# Patient Record
Sex: Male | Born: 1980
Health system: Southern US, Community
[De-identification: ages and names within clinical notes are randomized; demographics above are authoritative.]

---

## 2012-04-22 ENCOUNTER — Encounter (HOSPITAL_COMMUNITY): Payer: Self-pay

## 2012-04-22 ENCOUNTER — Emergency Department (HOSPITAL_COMMUNITY)
Admission: EM | Admit: 2012-04-22 | Discharge: 2012-04-22 | Disposition: A | Discharge status: Home / Self Care | Payer: Managed Care, Other (non HMO) | Source: Home / Self Care | Attending: Family Medicine | Admitting: Family Medicine

## 2012-04-22 DIAGNOSIS — K529 Noninfective gastroenteritis and colitis, unspecified: Secondary | ICD-10-CM

## 2012-04-22 DIAGNOSIS — K5289 Other specified noninfective gastroenteritis and colitis: Secondary | ICD-10-CM

## 2012-04-22 MED ORDER — ONDANSETRON HCL 4 MG PO TABS: 4.0000 mg | ORAL_TABLET | Freq: Four times a day (QID) | ORAL | Status: AC

## 2012-04-22 NOTE — ED Notes (Signed)
C/o n/v, HA since 6-16; NAD at present; has used tylenol w minimal relief ; needs note for job

## 2012-04-22 NOTE — ED Provider Notes (Signed)
History     CSN: [id number]  Arrival date & time 04/22/12  1236   First MD Initiated Contact with Patient 04/22/12 1246      Chief Complaint  Patient presents with  . Headache    (Consider location/radiation/quality/duration/timing/severity/associated sxs/prior treatment) Patient is a 31 y.o. male presenting with headaches. The history is provided by the patient.  Headache The primary symptoms include headaches, nausea and vomiting. Primary symptoms do not include fever. The symptoms began 3 to 5 days ago. The symptoms are resolved. Context: onset after eating some food, , no diarrhea, , headache mild  from not eating well.    History reviewed. No pertinent past medical history.  History reviewed. No pertinent past surgical history.  History reviewed. No pertinent family history.  History  Substance Use Topics  . Smoking status: Never Smoker   . Smokeless tobacco: Not on file  . Alcohol Use: Yes      Review of Systems  Constitutional: Positive for appetite change. Negative for fever and chills.  Respiratory: Negative for cough.   Gastrointestinal: Positive for nausea and vomiting.  Neurological: Positive for headaches.    Allergies  Review of patient's allergies indicates no known allergies.  Home Medications   Current Outpatient Rx  Name Route Sig Dispense Refill  . ONDANSETRON HCL 4 MG PO TABS Oral Take 1 tablet (4 mg total) by mouth every 6 (six) hours. 6 tablet 0    BP 141/73  Pulse 62  Temp 98 F (36.7 C) (Oral)  Resp 16  SpO2 100%  Physical Exam  Nursing note and vitals reviewed. Constitutional: He is oriented to person, place, and time. He appears well-developed and well-nourished.  HENT:  Head: Normocephalic.  Right Ear: External ear normal.  Left Ear: External ear normal.  Mouth/Throat: Oropharynx is clear and moist.  Eyes: Conjunctivae are normal. Pupils are equal, round, and reactive to light.  Neck: Normal range of motion. Neck supple.    Pulmonary/Chest: Breath sounds normal.  Abdominal: Soft. Bowel sounds are normal. There is no tenderness. There is no rebound.  Lymphadenopathy:    He has no cervical adenopathy.  Neurological: He is alert and oriented to person, place, and time.  Skin: Skin is warm and dry. No rash noted.  Psychiatric: He has a normal mood and affect.    ED Course  Procedures (including critical care time)  Labs Reviewed - No data to display No results found.   1. Gastroenteritis, acute       MDM          Linna Hoff, MD 04/22/12 1339

## 2014-07-06 ENCOUNTER — Ambulatory Visit (INDEPENDENT_AMBULATORY_CARE_PROVIDER_SITE_OTHER): Payer: 59 | Admitting: Internal Medicine

## 2014-07-06 VITALS — BP 122/80 | HR 60 | Temp 98.4°F | Resp 17 | Ht 66.0 in | Wt 178.0 lb

## 2014-07-06 DIAGNOSIS — K047 Periapical abscess without sinus: Secondary | ICD-10-CM

## 2014-07-06 MED ORDER — HYDROCODONE-ACETAMINOPHEN 5-325 MG PO TABS: 1.0000 | ORAL_TABLET | Freq: Four times a day (QID) | ORAL | Status: DC | PRN

## 2014-07-06 MED ORDER — AMOXICILLIN 500 MG PO CAPS: 1000.0000 mg | ORAL_CAPSULE | Freq: Two times a day (BID) | ORAL | Status: DC

## 2014-07-06 NOTE — Progress Notes (Signed)
   Subjective:  This chart was scribed for Tonye Pearson, MD by Bronson Curb, ED Scribe. This patient was seen in room Room/bed info 12 and the patient's care was started at 12:33 PM.   Patient ID: Evan Johnson, male    DOB: 1981/01/16, 33 y.o.   MRN: [id number]  HPI  HPI Comments: ZOHAIR EPP is a 33 y.o. male who presents to the [facility] complaining of constant, moderate, left sided dental pain onset 1 week ago. There is associated subjective fever (triage temp 98.4 F) and left sided facial swelling. Patient reports the pain is exacerbated when biting down. Patient has no history of significant health conditions.  There are no active problems to display for this patient.   Review of Systems  Constitutional: Positive for fever (subjective).  HENT: Positive for dental problem (left side) and facial swelling (left side).        Objective:   Physical Exam  Nursing note and vitals reviewed. Constitutional: He is oriented to person, place, and time. He appears well-developed and well-nourished. No distress.  HENT:  Head: Normocephalic and atraumatic.  Face is on swollen on the left side. Malar tender to palpation. Left upper molar is destroyed by disease with redness and swelling at the gumline.  Eyes: Conjunctivae and EOM are normal.  Neck: Neck supple.  Cardiovascular: Normal rate.   Pulmonary/Chest: Effort normal. No respiratory distress.  Musculoskeletal: Normal range of motion.  Neurological: He is alert and oriented to person, place, and time.  Skin: Skin is warm and dry.  Psychiatric: He has a normal mood and affect. His behavior is normal.     BP 122/80  Pulse 60  Temp(Src) 98.4 F (36.9 C) (Oral)  Resp 17  Ht  (1.676 m)  Wt 178 lb (80.74 kg)  BMI 28.74 kg/m2  SpO2 100%      Assessment & Plan:    I have completed the patient encounter in its entirety as documented by the scribe, with editing by me where  necessary. Aleayah Chico P. Merla Riches, M.D. Dental abscess  Meds ordered this encounter  Medications  . ibuprofen (ADVIL,MOTRIN) 200 MG tablet    Sig: Take 200 mg by mouth every 6 (six) hours as needed.  Marland Kitchen amoxicillin (AMOXIL) 500 MG capsule    Sig: Take 2 capsules (1,000 mg total) by mouth 2 (two) times daily.    Dispense:  40 capsule    Refill:  0  . HYDROcodone-acetaminophen (NORCO/VICODIN) 5-325 MG per tablet    Sig: Take 1 tablet by mouth every 6 (six) hours as needed for moderate pain.    Dispense:  20 tablet    Refill:  0   To DDS in 1 week

## 2014-07-10 ENCOUNTER — Ambulatory Visit: Payer: 59

## 2014-07-11 ENCOUNTER — Ambulatory Visit (INDEPENDENT_AMBULATORY_CARE_PROVIDER_SITE_OTHER): Payer: 59

## 2014-07-11 ENCOUNTER — Ambulatory Visit (INDEPENDENT_AMBULATORY_CARE_PROVIDER_SITE_OTHER): Payer: 59 | Admitting: Family Medicine

## 2014-07-11 VITALS — BP 120/72 | HR 70 | Temp 99.7°F | Resp 18 | Ht 66.5 in | Wt 171.0 lb

## 2014-07-11 DIAGNOSIS — K047 Periapical abscess without sinus: Secondary | ICD-10-CM

## 2014-07-11 MED ORDER — CLINDAMYCIN HCL 300 MG PO CAPS: 300.0000 mg | ORAL_CAPSULE | Freq: Three times a day (TID) | ORAL | Status: DC

## 2014-07-11 NOTE — Patient Instructions (Signed)

## 2014-07-11 NOTE — Progress Notes (Signed)
Is a 34 year old gentleman who is originally from [place]. 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 He's had 2 weeks of swollen left cheek with mild aching. He knows he has bad teeth.  Patient was seen a week ago and was put on amoxicillin and hydrocodone. Unfortunately this is not resulted in any improvement.  Objective: Patient has a severely infected gum surrounding tooth #14. The entire left cheek is mildly tender and markedly swollen. She does have moderate trismus but the tongue is midline uvula is midline. Ears are normal.  [facility] reading (PRIMARY) by  Dr. Milus Glazier:  Normal waters view .   Assessment: Moderately severe dental abscess. He needs to see an oral surgeon for definitive care.  Plan: Change amoxicillin to clindamycin and referred to oral surgeon urgently Signed, Sheila Oats.D.

## 2014-07-19 ENCOUNTER — Ambulatory Visit (INDEPENDENT_AMBULATORY_CARE_PROVIDER_SITE_OTHER): Payer: 59 | Admitting: Family Medicine

## 2014-07-19 VITALS — BP 100/60 | HR 69 | Temp 97.8°F | Resp 16 | Ht 66.0 in | Wt 172.0 lb

## 2014-07-19 DIAGNOSIS — K047 Periapical abscess without sinus: Secondary | ICD-10-CM

## 2014-07-19 MED ORDER — HYDROCODONE-ACETAMINOPHEN 5-325 MG PO TABS: 1.0000 | ORAL_TABLET | Freq: Four times a day (QID) | ORAL | Status: DC | PRN

## 2014-07-19 MED ORDER — CLINDAMYCIN HCL 300 MG PO CAPS
300.0000 mg | ORAL_CAPSULE | Freq: Three times a day (TID) | ORAL | Status: DC
Start: 2014-07-19 — End: 2014-08-07

## 2014-07-19 MED ORDER — CHLORHEXIDINE GLUCONATE 0.12 % MT SOLN
15.0000 mL | Freq: Four times a day (QID) | OROMUCOSAL | Status: DC
Start: 2014-07-19 — End: 2014-10-11

## 2014-07-19 NOTE — Patient Instructions (Signed)

## 2014-07-19 NOTE — Progress Notes (Signed)
Subjective:    Patient ID: Evan Johnson, male    DOB: 28-Jul-1981, 33 y.o.   MRN: [id number] Chief Complaint  Patient presents with  . Medication Refill    Dentist will not see till swelling goes down    HPI  Was initially started on amoxicillin for left dental abscess but sxs worsened - developed trismus.  Then 1 wk ago was transitioned to clindamycin and has gotten much better since the. Did have a lot of blood draining out last week - filled a whole cup full but no further drainage and swelling has gone down immensely and can open his mouth normally now.  Has been gargling and spitting with hydrogen peroxide.  Was seen by an oral surgeon but he told him just to come back after the infection cleared for more definitive management.  Has been having more of a sore throat on the left - hurts to swallow.  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  History reviewed. No pertinent past medical history. Current Outpatient Prescriptions on File Prior to Visit  Medication Sig Dispense Refill  . ibuprofen (ADVIL,MOTRIN) 200 MG tablet Take 200 mg by mouth every 6 (six) hours as needed.       No current facility-administered medications on file prior to visit.   No Known Allergies   Review of Systems  Constitutional: Negative for fever, chills, diaphoresis and activity change.  HENT: Positive for dental problem, mouth sores, sore throat and trouble swallowing. Negative for drooling.   Respiratory: Negative for cough and shortness of breath.   Cardiovascular: Negative for chest pain and leg swelling.  Musculoskeletal: Positive for neck pain and neck stiffness.  Neurological: Positive for facial asymmetry and headaches. Negative for dizziness, syncope, weakness and light-headedness.  Hematological: Positive for adenopathy.       Objective:  BP 100/60  Pulse 69  Temp(Src) 97.8 F (36.6 C) (Oral)  Resp 16  Ht  (1.676 m)  Wt 172 lb (78.019 kg)  BMI 27.77 kg/m2   SpO2 100%  Physical Exam  Constitutional: He is oriented to person, place, and time. He appears well-developed and well-nourished. No distress.  HENT:  Head: Normocephalic and atraumatic.  Mouth/Throat: Uvula is midline, oropharynx is clear and moist and mucous membranes are normal. No trismus in the jaw. Abnormal dentition. Dental abscesses and dental caries present. No uvula swelling. No oropharyngeal exudate.  Multiple teeth in left upper and lower molars with decay - many broken off at roots with brown caries - left parotid area very firm, bright erythema in gums surrounding upper and lower left teeth and inner cheek pale and bulging  Eyes: No scleral icterus.  Pulmonary/Chest: Effort normal.  Lymphadenopathy:       Head (right side): No submental, no submandibular, no tonsillar, no preauricular and no posterior auricular adenopathy present.       Head (left side): Submandibular adenopathy present. No submental, no tonsillar, no preauricular and no posterior auricular adenopathy present.    He has no cervical adenopathy.       Right: No supraclavicular adenopathy present.       Left: No supraclavicular adenopathy present.  Neurological: He is alert and oriented to person, place, and time.  Skin: Skin is warm and dry. He is not diaphoretic.  Psychiatric: He has a normal mood and affect. His behavior is normal.      Assessment & Plan:   Dental abscess - Plan: clindamycin (CLEOCIN) 300 MG  capsule Refilled antibiotics x 2 wks - f/u w/ oral surgeon asap.  Warned of c. Diff diarrhea - handwashing and start probiotics/yogurt.  RTC immed if worsening at all.  Ok to cont hydrogen peroxide gargles - alternate w/ chlorhexidine gargles. Meds ordered this encounter  Medications  . clindamycin (CLEOCIN) 300 MG capsule    Sig: Take 1 capsule (300 mg total) by mouth 3 (three) times daily.    Dispense:  42 capsule    Refill:  0  . chlorhexidine (PERIDEX) 0.12 % solution    Sig: Use as directed 15  mLs in the mouth or throat 4 (four) times daily.    Dispense:  480 mL    Refill:  0  . HYDROcodone-acetaminophen (NORCO/VICODIN) 5-325 MG per tablet    Sig: Take 1 tablet by mouth every 6 (six) hours as needed for moderate pain.    Dispense:  40 tablet    Refill:  0     Norberto Sorenson, MD MPH

## 2014-08-07 ENCOUNTER — Ambulatory Visit (INDEPENDENT_AMBULATORY_CARE_PROVIDER_SITE_OTHER): Payer: 59 | Admitting: Family Medicine

## 2014-08-07 VITALS — BP 102/68 | HR 58 | Temp 97.8°F | Resp 16 | Ht 67.25 in | Wt 175.8 lb

## 2014-08-07 DIAGNOSIS — R197 Diarrhea, unspecified: Secondary | ICD-10-CM

## 2014-08-07 DIAGNOSIS — T3695XA Adverse effect of unspecified systemic antibiotic, initial encounter: Secondary | ICD-10-CM

## 2014-08-07 DIAGNOSIS — K529 Noninfective gastroenteritis and colitis, unspecified: Secondary | ICD-10-CM

## 2014-08-07 DIAGNOSIS — K521 Toxic gastroenteritis and colitis: Secondary | ICD-10-CM

## 2014-08-07 MED ORDER — METRONIDAZOLE 500 MG PO TABS
500.0000 mg | ORAL_TABLET | Freq: Three times a day (TID) | ORAL | Status: DC
Start: 2014-08-07 — End: 2014-10-11

## 2014-08-07 NOTE — Patient Instructions (Signed)
Do NOT start the metronidazole until AFTER you collect the stool studies.  Drop off the stool studies as soon as possible - hopefully you can get them back today.  Then start the metronidazole with breakfast, lunch, and dinner - three times a day.  When you stool tests come back, we will let you know if you need to continue it and if so for how long.  Clostridium Difficile Infection Clostridium difficile (C. difficile) is a germ found in the intestines. C. difficile infection can occur after taking some medicines. C. difficile infection can cause watery poop (diarrhea) or severe disease. HOME CARE  Drink enough fluids to keep your pee (urine) clear or pale yellow. Avoid milk, caffeine, and alcohol.  Ask your doctor how to replace body fluid losses (rehydrate).  Eat small meals more often rather than large meals.  Take your medicine (antibiotics) as told. Finish it even if you start to feel better.  Do not  use medicines to slow the watery poop.  Wash your hands well after using the bathroom and before preparing food.  Make sure people who live with you wash their hands often.  Clean all surfaces. Use a product that contains chlorine bleach. GET HELP RIGHT AWAY IF:   The watery poop does not stop, or it comes back after you finish your medicine.  You feel very dry or thirsty (dehydrated).  You have a fever.  You have more belly (abdominal) pain or tenderness.  There is blood in your poop (stool), or your poop is black and tar-like.  You cannot eat food or drink liquids without throwing up (vomiting). MAKE SURE YOU:  Understand these instructions.  Will watch your condition.  Will get help right away if you are not doing well or get worse. Document Released: 08/16/2009 Document Revised: 03/05/2014 Document Reviewed: 03/27/2011 [facility] Patient Information 2015 [place], [place]. This information is not intended to replace advice given to you by your health care provider. Make  sure you discuss any questions you have with your health care provider.  Clostridium Difficile FAQs What is Clostridium difficile infection?  Clostridium difficile [pronounced Klo-STRID-ee-um dif-uh-SEEL], also known as "C. diff" [See-dif], is a germ that can cause diarrhea. Most cases of C. diff infection occur in patients taking antibiotics. The most common symptoms of a C. diff infection include:  Watery diarrhea  Fever  Loss of appetite  Nausea  Belly pain and tenderness Who is most likely to get C. diff infection? The elderly and people with certain medical problems have the greatest chance of getting C. diff. C. diff spores can live outside the human body for a very long time and may be found on things in the environment such as bed linens, bed rails, bathroom fixtures, and medical equipment. C. diff infection can spread from person-to-person on contaminated equipment and on the hands of doctors, nurses, other healthcare providers and visitors. Can C. diff infection be treated? Yes, there are antibiotics that can be used to treat C. diff. In some severe cases, a person might have to have surgery to remove the infected part of the intestines. This surgery is needed in only 1 or 2 out of every 100 persons with C. diff. What are some of the things that hospitals are doing to prevent C. diff infections? To prevent C. diff infections, doctors, nurses, and other healthcare providers:  Clean their hands with soap and water or an alcohol-based hand rub before and after caring for every patient. This can prevent  C. diff and other germs from being passed from one patient to another on their hands.  Carefully clean hospital rooms and medical equipment that have been used for patients with C. diff.  Use Contact Precautions to prevent C. diff from spreading to other patients. Contact Precautions mean:  Whenever possible, patients with C. diff will have a single room or share a room only with  someone else who also has C. diff.  Healthcare providers will put on gloves and wear a gown over their clothing while taking care of patients with C. diff.  Visitors may also be asked to wear a gown and gloves.  When leaving the room, hospital providers and visitors remove their gown and gloves and clean their hands.  Patients on Contact Precautions are asked to stay in their hospital rooms as much as possible. They should not go to common areas, such as the gift shop or cafeteria. They can go to other areas of the hospital for treatments and tests.  Only give patients antibiotics when it is necessary. What can I do to help prevent C. diff infections?  Make sure that all doctors, nurses, and other healthcare providers clean their hands with soap and water or an alcohol-based hand rub before and after caring for you.  If you do not see your providers clean their hands, please ask them to do so.  Only take antibiotics as prescribed by your doctor.  Be sure to clean your own hands often, especially after using the bathroom and before eating. Can my friends and family get C. diff when they visit me? C. diff infection usually does not occur in persons who are not taking antibiotics. Visitors are not likely to get C. diff. Still, to make it safer for visitors, they should:  Clean their hands before they enter your room and as they leave your room  Ask the nurse if they need to wear protective gowns and gloves when they visit you. What do I need to do when I go home from the hospital? Once you are back at home, you can return to your normal routine. Often, the diarrhea will be better or completely gone before you go home. This makes giving C. diff to other people much less likely. There are a few things you should do, however, to lower the chances of developing C. diff infection again or of spreading it to others.  If you are given a prescription to treat C. diff, take the medicine exactly as  prescribed by your doctor and pharmacist. Do not take half-doses or stop before you run out.  Wash your hands often, especially after going to the bathroom and before preparing food.  People who live with you should wash their hands often as well.  If you develop more diarrhea after you get home, tell your doctor immediately.  Your doctor may give you additional instructions. If you have questions, please ask your doctor or nurse. Developed and co-sponsored by [employer] Society for [facility] [employer] of [place] [phone](SHEA); Infectious Diseases Society of America (IDSA); [facility]; Association for Professionals in Infection Control and Epidemiology (APIC); Centers for Disease Control and Prevention (CDC); and [employer] Commission. Document Released: 10/24/2013 Document Reviewed: 10/24/2013 [facility] Patient Information 2015 [place]. This information is not intended to replace advice given to you by your health care provider. Make sure you discuss any questions you have with your health care provider.

## 2014-08-07 NOTE — Progress Notes (Signed)
Subjective:    Patient ID: ALAIN DESCHENE, male    DOB: 07-Sep-1981, 33 y.o.   MRN: [id number]  This chart was scribed for Sherren Mocha, MD by Tonye Royalty, ED Scribe. This patient was seen in room 3 and the patient's care was started at 10:16 AM.   Chief Complaint  Patient presents with  . Diarrhea    started almost a month since he came in for his dental abscess--thinks it is the medication   HPI  HPI Comments: WELLS MABE is a 33 y.o. male who presents to the [facility] complaining of diarrhea with onset a few weeks ago. He was seen 1 month previously for dental abscess, had worsened on amoxicillin, then transitioned to Clindamycin which he began to improve on so was kept for a 3 week course, which he finished 2 days ago. He states he has diarrhea soon after he eats or drinks. He states that his diarrhea is a mixture of fluid and loose stool. He states he consumes a lot of fluid. He denies discoloration or foul odor to his diarrhea. He denies nausea, vomiting, appetite change, or urinary abnormalities. He states there is nobody else sick at his residence. He states he is not taking any probiotics. He works in a hospital. He states he is going to see a Designer, industrial/product this weekend.   History reviewed. No pertinent past medical history.  Current Outpatient Prescriptions on File Prior to Visit  Medication Sig Dispense Refill  . chlorhexidine (PERIDEX) 0.12 % solution Use as directed 15 mLs in the mouth or throat 4 (four) times daily.  480 mL  0  . clindamycin (CLEOCIN) 300 MG capsule Take 1 capsule (300 mg total) by mouth 3 (three) times daily.  42 capsule  0  . HYDROcodone-acetaminophen (NORCO/VICODIN) 5-325 MG per tablet Take 1 tablet by mouth every 6 (six) hours as needed for moderate pain.  40 tablet  0  . ibuprofen (ADVIL,MOTRIN) 200 MG tablet Take 200 mg by mouth every 6 (six) hours as needed.       No current facility-administered medications on file prior to  visit.   No Known Allergies   Review of Systems  Constitutional: Negative for appetite change.  Gastrointestinal: Positive for diarrhea (denies discoloration or foul smell). Negative for nausea and vomiting.    BP 102/68  Pulse 58  Temp(Src) 97.8 F (36.6 C) (Oral)  Resp 16  Ht 5' 7.25" (1.708 m)  Wt 175 lb 12.8 oz (79.742 kg)  BMI 27.33 kg/m2  SpO2 100%      Objective:   Physical Exam  Nursing note and vitals reviewed. Constitutional: He is oriented to person, place, and time. He appears well-developed and well-nourished.  HENT:  Head: Normocephalic and atraumatic.  Eyes: Conjunctivae are normal.  Neck: Normal range of motion. Neck supple.  Cardiovascular: Normal rate, regular rhythm and normal heart sounds.   No murmur heard. Normal S1 S2  Pulmonary/Chest: Effort normal and breath sounds normal. No respiratory distress. He has no wheezes. He has no rales.  Abdominal: Soft. He exhibits no distension and no mass. There is no tenderness.  increased bowel sounds, no hepatomegaly  Genitourinary:  No CVA tenderness  Musculoskeletal: Normal range of motion.  Neurological: He is alert and oriented to person, place, and time.  Skin: Skin is warm and dry.  Psychiatric: He has a normal mood and affect.    No results found for this or any previous visit.  Assessment & Plan:  I discussed with the patient the possibility of C. Difficile infection due to his recent prolonged clindamycin yes. He states he does not want to be out of work, so I discussed with him my plan to start treatment for C. Difficile as soon as he drops off his stool samples within the next 24 hrs and reevaluate after his fecal test results are obtained. 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  Diarrhea - Plan: Stool culture, Fecal lactoferrin, Clostridium Difficile by PCR, IFOBT POC (occult bld,  rslt in office)  Antibiotics causing adverse effect in therapeutic use, initial encounter - suspect c. Diff due to recently prolonged clindamycin use after dental infection - pt works in [employer].  Antibiotic-associated diarrhea  Meds ordered this encounter  Medications  . metroNIDAZOLE (FLAGYL) 500 MG tablet    Sig: Take 1 tablet (500 mg total) by mouth 3 (three) times daily.    Dispense:  42 tablet    Refill:  0    I personally performed the services described in this documentation, which was scribed in my presence. The recorded information has been reviewed and considered, and addended by me as needed.  Norberto SorensonEva Winston Misner, MD MPH

## 2014-08-14 ENCOUNTER — Ambulatory Visit (INDEPENDENT_AMBULATORY_CARE_PROVIDER_SITE_OTHER): Payer: 59 | Admitting: Family Medicine

## 2014-08-14 VITALS — BP 100/60 | HR 54 | Temp 98.3°F | Resp 16 | Ht 66.25 in | Wt 171.4 lb

## 2014-08-14 DIAGNOSIS — K521 Toxic gastroenteritis and colitis: Secondary | ICD-10-CM

## 2014-08-14 DIAGNOSIS — R197 Diarrhea, unspecified: Secondary | ICD-10-CM

## 2014-08-14 DIAGNOSIS — T3695XA Adverse effect of unspecified systemic antibiotic, initial encounter: Secondary | ICD-10-CM

## 2014-08-14 DIAGNOSIS — A047 Enterocolitis due to Clostridium difficile: Secondary | ICD-10-CM

## 2014-08-14 NOTE — Patient Instructions (Signed)
Dehydration, Adult Dehydration is when you lose more fluids from the body than you take in. Vital organs like the kidneys, brain, and heart cannot function without a proper amount of fluids and salt. Any loss of fluids from the body can cause dehydration.  CAUSES   Vomiting.  Diarrhea.  Excessive sweating.  Excessive urine output.  Fever. SYMPTOMS  Mild dehydration  Thirst.  Dry lips.  Slightly dry mouth. Moderate dehydration  Very dry mouth.  Sunken eyes.  Skin does not bounce back quickly when lightly pinched and released.  Dark urine and decreased urine production.  Decreased tear production.  Headache. Severe dehydration  Very dry mouth.  Extreme thirst.  Rapid, weak pulse (more than 100 beats per minute at rest).  Cold hands and feet.  Not able to sweat in spite of heat and temperature.  Rapid breathing.  Blue lips.  Confusion and lethargy.  Difficulty being awakened.  Minimal urine production.  No tears. DIAGNOSIS  Your caregiver will diagnose dehydration based on your symptoms and your exam. Blood and urine tests will help confirm the diagnosis. The diagnostic evaluation should also identify the cause of dehydration. TREATMENT  Treatment of mild or moderate dehydration can often be done at home by increasing the amount of fluids that you drink. It is best to drink small amounts of fluid more often. Drinking too much at one time can make vomiting worse. Refer to the home care instructions below. Severe dehydration needs to be treated at the hospital where you will probably be given intravenous (IV) fluids that contain water and electrolytes. HOME CARE INSTRUCTIONS   Ask your caregiver about specific rehydration instructions.  Drink enough fluids to keep your urine clear or pale yellow.  Drink small amounts frequently if you have nausea and vomiting.  Eat as you normally do.  Avoid:  Foods or drinks high in sugar.  Carbonated  drinks.  Juice.  Extremely hot or cold fluids.  Drinks with caffeine.  Fatty, greasy foods.  Alcohol.  Tobacco.  Overeating.  Gelatin desserts.  Wash your hands well to avoid spreading bacteria and viruses.  Only take over-the-counter or prescription medicines for pain, discomfort, or fever as directed by your caregiver.  Ask your caregiver if you should continue all prescribed and over-the-counter medicines.  Keep all follow-up appointments with your caregiver. SEEK MEDICAL CARE IF:  You have abdominal pain and it increases or stays in one area (localizes).  You have a rash, stiff neck, or severe headache.  You are irritable, sleepy, or difficult to awaken.  You are weak, dizzy, or extremely thirsty. SEEK IMMEDIATE MEDICAL CARE IF:   You are unable to keep fluids down or you get worse despite treatment.  You have frequent episodes of vomiting or diarrhea.  You have blood or green matter (bile) in your vomit.  You have blood in your stool or your stool looks black and tarry.  You have not urinated in 6 to 8 hours, or you have only urinated a small amount of very dark urine.  You have a fever.  You faint. MAKE SURE YOU:   Understand these instructions.  Will watch your condition.  Will get help right away if you are not doing well or get worse. Document Released: 10/19/2005 Document Revised: 01/11/2012 Document Reviewed: 06/08/2011 ExitCare Patient Information 2015 [employer]. This information is not intended to replace advice given to you by your health care provider. Make sure you discuss any questions you have with your health care   provider.   Diarrhea Diarrhea is frequent loose and watery bowel movements. It can cause you to feel weak and dehydrated. Dehydration can cause you to become tired and thirsty, have a dry mouth, and have decreased urination that often is dark yellow. Diarrhea is a sign of another problem, most often an infection that will  not last long. In most cases, diarrhea typically lasts 2-3 days. However, it can last longer if it is a sign of something more serious. It is important to treat your diarrhea as directed by your caregiver to lessen or prevent future episodes of diarrhea. CAUSES  Some common causes include:  Gastrointestinal infections caused by viruses, bacteria, or parasites.  Food poisoning or food allergies.  Certain medicines, such as antibiotics, chemotherapy, and laxatives.  Artificial sweeteners and fructose.  Digestive disorders. HOME CARE INSTRUCTIONS  Ensure adequate fluid intake (hydration): Have 1 cup (8 oz) of fluid for each diarrhea episode. Avoid fluids that contain simple sugars or sports drinks, fruit juices, whole milk products, and sodas. Your urine should be clear or pale yellow if you are drinking enough fluids. Hydrate with an oral rehydration solution that you can purchase at pharmacies, retail stores, and online. You can prepare an oral rehydration solution at home by mixing the following ingredients together:   - tsp table salt.   tsp baking soda.   tsp salt substitute containing potassium chloride.  1  tablespoons sugar.  1 L (34 oz) of water.  Certain foods and beverages may increase the speed at which food moves through the gastrointestinal (GI) tract. These foods and beverages should be avoided and include:  Caffeinated and alcoholic beverages.  High-fiber foods, such as raw fruits and vegetables, nuts, seeds, and whole grain breads and cereals.  Foods and beverages sweetened with sugar alcohols, such as xylitol, sorbitol, and mannitol.  Some foods may be well tolerated and may help thicken stool including:  Starchy foods, such as rice, toast, pasta, low-sugar cereal, oatmeal, grits, baked potatoes, crackers, and bagels.  Bananas.  Applesauce.  Add probiotic-rich foods to help increase healthy bacteria in the GI tract, such as yogurt and fermented milk  products.  Wash your hands well after each diarrhea episode.  Only take over-the-counter or prescription medicines as directed by your caregiver.  Take a warm bath to relieve any burning or pain from frequent diarrhea episodes. SEEK IMMEDIATE MEDICAL CARE IF:   You are unable to keep fluids down.  You have persistent vomiting.  You have blood in your stool, or your stools are black and tarry.  You do not urinate in 6-8 hours, or there is only a small amount of very dark urine.  You have abdominal pain that increases or localizes.  You have weakness, dizziness, confusion, or light-headedness.  You have a severe headache.  Your diarrhea gets worse or does not get better.  You have a fever or persistent symptoms for more than 2-3 days.  You have a fever and your symptoms suddenly get worse. MAKE SURE YOU:   Understand these instructions.  Will watch your condition.  Will get help right away if you are not doing well or get worse. Document Released: 10/09/2002 Document Revised: 03/05/2014 Document Reviewed: 06/26/2012 [facility] Patient Information 2015 [place], [place]. This information is not intended to replace advice given to you by your health care provider. Make sure you discuss any questions you have with your health care provider.   This is what I suspect you have but the stool tests  will tell us if you do have this or note.  Clostridium Difficile Infection Clostridium difficile (C. difficile) is a bacteria found in the intestinal tract or colon. Under certain conditions, it causes diarrhea and sometimes severe disease. The severe form of the disease is known as pseudomembranous colitis (often called C. difficile colitis). This disease can damage the lining of the colon or cause the colon to become enlarged (toxic megacolon). CAUSES Your colon normally contains many different bacteria, including C. difficile. The balance of bacteria in your colon can change during illness.  This is especially true when you take antibiotic medicine. Taking antibiotics may allow the C. difficile to grow, multiply excessively, and make a toxin that then causes illness. The elderly and people with certain medical conditions have a greater risk of getting C. difficile infections. SYMPTOMS  Watery diarrhea.  Fever.  Fatigue.  Loss of appetite.  Nausea.  Abdominal swelling, pain, or tenderness.  Dehydration. DIAGNOSIS Your symptoms may make your caregiver suspect a C. difficile infection, especially if you have used antibiotics in the preceding weeks. However, there are only 2 ways to know for certain whether you have a C. difficile infection:  A lab test that finds the toxin in your stool.  The specific appearance of an abnormality (pseudomembrane) in your colon. This can only be seen by doing a sigmoidoscopy or colonoscopy. These procedures involve passing an instrument through your rectum to look at the inside of your colon. Your caregiver will help determine if these tests are necessary. TREATMENT  Most people are successfully treated with one of two specific antibiotics, usually given by mouth. Other antibiotics you are receiving are stopped if possible.  Intravenous (IV) fluids and correction of electrolyte imbalance may be necessary.  Rarely, surgery may be needed to remove the infected part of the intestines.  Careful hand washing by you and your caregivers is important to prevent the spread of infection. In the hospital, your caregivers may also put on gowns and gloves to prevent the spread of the C. difficile bacteria. Your room is also cleaned regularly with a solution containing bleach or a product that is known to kill C. difficile. HOME CARE INSTRUCTIONS  Drink enough fluids to keep your urine clear or pale yellow. Avoid milk, caffeine, and alcohol.  Ask your caregiver for specific rehydration instructions.  Try eating small, frequent meals rather than large  meals.  Take your antibiotics as directed. Finish them even if you start to feel better.  Do not use medicines to slow diarrhea. This could delay healing or cause complications.  Wash your hands thoroughly after using the bathroom and before preparing food.  Make sure people who live with you wash their hands often, too.  Carefully disinfect all surfaces with a product that contains chlorine bleach. SEEK MEDICAL CARE IF:  Diarrhea persists longer than expected or recurs after completing your course of antibiotic treatment for the C. difficile infection.  You have trouble staying hydrated. SEEK IMMEDIATE MEDICAL CARE IF:  You develop a new fever.  You have increasing abdominal pain or tenderness.  There is blood in your stools, or your stools are dark black and tarry.  You cannot hold down food or liquids. MAKE SURE YOU:  Understand these instructions.  Will watch your condition.  Will get help right away if you are not doing well or get worse. Document Released: 07/29/2005 Document Revised: 03/05/2014 Document Reviewed: 03/27/2011 [facility]Exit[facility] Patient Information 2015 [place]ExitCare, [place]LLC. This information is not intended to replace  advice given to you by your health care provider. Make sure you discuss any questions you have with your health care provider.  

## 2014-08-14 NOTE — Progress Notes (Signed)
Subjective:    Patient ID: Evan Johnson, male    DOB: 23-Mar-1981, [age] y.o.   MRN: [id number] This chart was scribed for Norberto SorensonEva Tylee Yum, MD by Littie Deedsichard Sun, Medical Scribe. This patient was seen in Room 2 and the patient's care was started at 8:57 AM.  Chief Complaint  Patient presents with  . Follow-up    recheck on diarrhea issues from last OV on 08/07/14    HPI HPI Comments: Evan Johnson is a 33 y.o. male who presents to the [facility] for a follow-up.  He was seen 1 weeks previously for diarrhea, which he had had since treatment of dental abscess with clindamycin after failing a course of amoxicillin. Therefore, as he is a hospital employee, I was concerned he had developed C. Diff. Started on Metronidazole which he was instructed to start after he obtained stool for testing. It appears he has not dropped off the stool samples yet.  Treatment: He has obtained the medications, but has not started them because he hasn't done his stool samples yet.  Gastrointestinal: He still has diarrhea as of yesterday. Patient's last bowel movement was this morning with a little bit of slime. He states he ends up defecating after drinking water. His stool is sometimes green and sometimes yellow.  He notes having a mild HA. He denies fever, chills, abdominal pain, nausea, vomiting, lightheadedness, diziness, and urinary symptoms.  History reviewed. No pertinent past medical history. Current Outpatient Prescriptions on File Prior to Visit  Medication Sig Dispense Refill  . chlorhexidine (PERIDEX) 0.12 % solution Use as directed 15 mLs in the mouth or throat 4 (four) times daily.  480 mL  0  . ibuprofen (ADVIL,MOTRIN) 200 MG tablet Take 200 mg by mouth every 6 (six) hours as needed.      . metroNIDAZOLE (FLAGYL) 500 MG tablet Take 1 tablet (500 mg total) by mouth 3 (three) times daily.  42 tablet  0   No current facility-administered medications on file prior to visit.   No Known  Allergies   Review of Systems  Constitutional: Negative for fever, chills, appetite change and fatigue.  HENT: Negative for congestion, ear discharge and sinus pressure.   Eyes: Negative for discharge.  Respiratory: Negative for cough.   Cardiovascular: Negative for chest pain.  Gastrointestinal: Positive for diarrhea. Negative for nausea, vomiting and abdominal pain.  Genitourinary: Negative for dysuria, urgency, frequency, hematuria, decreased urine volume and difficulty urinating.  Musculoskeletal: Negative for back pain.  Skin: Negative for rash.  Neurological: Positive for headaches. Negative for dizziness, seizures and light-headedness.  Psychiatric/Behavioral: Negative for hallucinations.       Objective:  BP 100/60  Pulse 54  Temp(Src) 98.3 F (36.8 C) (Oral)  Resp 16  Ht 5' 6.25" (1.683 m)  Wt 171 lb 6.4 oz (77.747 kg)  BMI 27.45 kg/m2  SpO2 99%  Physical Exam  Nursing note and vitals reviewed. Constitutional: He is oriented to person, place, and time. He appears well-developed and well-nourished. No distress.  HENT:  Head: Normocephalic and atraumatic.  Mouth/Throat: Oropharynx is clear and moist. No oropharyngeal exudate.  Eyes: Pupils are equal, round, and reactive to light.  Neck: Neck supple.  Cardiovascular: Normal rate, regular rhythm and normal heart sounds.   Pulmonary/Chest: Effort normal.  Abdominal: He exhibits no mass. There is tenderness. There is no rebound and no guarding.  Hyperactive bowel sounds. Generalized mild tenderness. No hepatosplenomegaly palpable.  Musculoskeletal: He exhibits no edema.  Neurological:  He is alert and oriented to person, place, and time. No cranial nerve deficit.  Skin: Skin is warm and dry. No rash noted.  Psychiatric: He has a normal mood and affect. His behavior is normal.          Assessment & Plan:  Will abstain from checking CBC today due to recent dental procedure which may lead to elevated WBCs. Patient  can start Metronidazole now as he was able to provide stool sample while if office - sent for clx, fecal lactoferring, and c. Diff.  Reviewed importance of hand hygiene. Start probiotics. Diarrhea  Antibiotic associated enterocolitis  No orders of the defined types were placed in this encounter.    I personally performed the services described in this documentation, which was scribed in my presence. The recorded information has been reviewed and considered, and addended by me as needed.  Norberto SorensonEva Doyl Bitting, MD MPH

## 2014-08-15 LAB — CLOSTRIDIUM DIFFICILE BY PCR: CDIFFPCR: DETECTED — AB

## 2014-08-15 LAB — FECAL LACTOFERRIN, QUANT: Lactoferrin: POSITIVE

## 2014-08-18 LAB — STOOL CULTURE

## 2014-10-11 ENCOUNTER — Ambulatory Visit (INDEPENDENT_AMBULATORY_CARE_PROVIDER_SITE_OTHER): Payer: 59 | Admitting: Family Medicine

## 2014-10-11 VITALS — BP 118/80 | HR 58 | Temp 98.4°F | Resp 18 | Ht 66.0 in | Wt 175.0 lb

## 2014-10-11 DIAGNOSIS — K5900 Constipation, unspecified: Secondary | ICD-10-CM

## 2014-10-11 LAB — HEMOCCULT GUIAC POC 1CARD (OFFICE): FECAL OCCULT BLD: POSITIVE

## 2014-10-11 MED ORDER — POLYETHYLENE GLYCOL 3350 17 GM/SCOOP PO POWD: 17.0000 g | Freq: Every day | ORAL | Status: DC

## 2014-10-11 MED ORDER — MAGNESIUM CITRATE PO SOLN: 1.0000 | Freq: Once | ORAL | Status: DC

## 2014-10-11 NOTE — Patient Instructions (Addendum)
Start once a day miralax. In addition to make sure your constipation is fully treated you can augment with colace or magnesium citrate which are available over the counter. You do NOT have any external hemorrhoids and I did not see any anal fissures (cuts) or feel any internal hemorrhoids.  Therefore, the bleeding and pain should be completely resolved as your constipation is treated. Start taking sitz baths (a little warm water in the tub with epsom salts) after a bowel movement and can use a cotton ball to apply topical witchhazel for any additional bleeding or painful bowel movements.  As we treat your constipation, the rest of your symptoms should resolve.  Anal Fissure, Adult An anal fissure is a small tear or crack in the skin around the opening of the butt (anus).Bleeding from the tear or crack usually stops on its own within a few minutes. The bleeding may happen every time you poop until the tear or crack heals. HOME CARE  Eat lots of fruit, whole grains, and vegetables. Avoid foods like bananas and dairy products. These foods can make it hard to poop.  Take a warm water bath (sitz bath) as told by your doctor.  Drink enough fluids to keep your pee (urine) clear or pale yellow.  Only take medicines as told by your doctor. Do not take aspirin.  Do not use numbing creams or hydrocortisone cream on the area. These creams can slow healing. GET HELP RIGHT AWAY IF:  Your tear or crack is not healed in 3 days.  You have more bleeding.  You have a fever.  You have watery poop (diarrhea) mixed with blood.  You have pain.  You are getting worse, not better. MAKE SURE YOU:   Understand these instructions.  Will watch your condition.  Will get help right away if you are not doing well or get worse. Document Released: 06/17/2011 Document Revised: 01/11/2012 Document Reviewed: 06/17/2011 [facility]Exit[facility] Patient Information 2015 [place]ExitCare, [place]LLC. This information is not intended to replace  advice given to you by your health care provider. Make sure you discuss any questions you have with your health care provider.

## 2014-10-11 NOTE — Progress Notes (Signed)
Subjective:  This chart was scribed for Norberto SorensonEva Shaw, MD by Charline BillsEssence Howell, ED Scribe. The patient was seen in room 12. Patient's care was started at 12:33 PM.   Patient ID: Evan Johnson, male    DOB: [birth year], 33 y.o.   MRN: [id number]  Chief Complaint  Patient presents with  . Abdominal Pain    Pt. states he has a stomach. Noticed a small amount of blood when using the restroom. x1 month    HPI HPI Comments: Evan Johnson is a 33 y.o. male who presents to the [facility] complaining of abdominal pain. Pt was diagnosed 2 months ago with clostridium difficile diarrhea and treated with Flagyl. Pt states that diarrhea resolved after taking Flagyl. Today he presents with abdominal pain over the past month. Pt states that he has noticed dark red blood on toilet paper after BMs. He describes the amount of blood as minimal. He denies nausea, vomiting. Pt has not tried a stool softener. No h/o hemorrhoids.   History reviewed. No pertinent past medical history.   Current Outpatient Prescriptions on File Prior to Visit  Medication Sig Dispense Refill  . chlorhexidine (PERIDEX) 0.12 % solution Use as directed 15 mLs in the mouth or throat 4 (four) times daily. (Patient not taking: Reported on 10/11/2014) 480 mL 0  . ibuprofen (ADVIL,MOTRIN) 200 MG tablet Take 200 mg by mouth every 6 (six) hours as needed.    . metroNIDAZOLE (FLAGYL) 500 MG tablet Take 1 tablet (500 mg total) by mouth 3 (three) times daily. (Patient not taking: Reported on 10/11/2014) 42 tablet 0   No current facility-administered medications on file prior to visit.   No Known Allergies  Review of Systems  Gastrointestinal: Positive for abdominal pain. Negative for nausea and vomiting.   Triage Vitals: BP 118/80 mmHg  Pulse 58  Temp(Src) 98.4 F (36.9 C) (Oral)  Resp 18  Ht 5\' 6"  (1.676 m)  Wt 175 lb (79.379 kg)  BMI 28.26 kg/m2  SpO2 99%     Objective:   Physical Exam  Constitutional: He is  oriented to person, place, and time. He appears well-developed and well-nourished. No distress.  HENT:  Head: Normocephalic and atraumatic.  Eyes: Conjunctivae and EOM are normal.  Neck: Neck supple. No tracheal deviation present.  Cardiovascular: Normal rate.   Pulmonary/Chest: Effort normal. No respiratory distress.  Abdominal: Normal appearance and bowel sounds are normal.  Genitourinary: Rectum normal.  Musculoskeletal: Normal range of motion.  Neurological: He is alert and oriented to person, place, and time.  Skin: Skin is warm and dry.  Psychiatric: He has a normal mood and affect. His behavior is normal.  Nursing note and vitals reviewed.  Results for orders placed or performed in visit on 10/11/14  POCT occult blood stool  Result Value Ref Range   Fecal Occult Blood, POC Positive    Card #1 Date 10/11/14    Card #2 Fecal Occult Blod, POC     Card #2 Date     Card #3 Fecal Occult Blood, POC     Card #3 Date        Assessment & Plan:   Constipation, unspecified constipation type - Plan: POCT occult blood stool Could try colace as well if needed.  Pt reassured no fissures or hemorroids.  Ok to try sitz bathes or topical rectal witchhazel if pain or bleeding recurs and f/u for futher eval if persists. Meds ordered this encounter  Medications  .  polyethylene glycol powder (GLYCOLAX/MIRALAX) powder    Sig: Take 17 g by mouth daily.    Dispense:  255 g    Refill:  1  . magnesium citrate SOLN    Sig: Take 296 mLs (1 Bottle total) by mouth once.    Dispense:  296 mL    Refill:  0    I personally performed the services described in this documentation, which was scribed in my presence. The recorded information has been reviewed and considered, and addended by me as needed.  Norberto SorensonEva Shaw, MD MPH

## 2015-02-21 ENCOUNTER — Ambulatory Visit (INDEPENDENT_AMBULATORY_CARE_PROVIDER_SITE_OTHER): Payer: PRIVATE HEALTH INSURANCE | Admitting: Emergency Medicine

## 2015-02-21 VITALS — BP 124/86 | HR 63 | Temp 98.5°F | Resp 16 | Ht 67.0 in | Wt 172.0 lb

## 2015-02-21 DIAGNOSIS — J4521 Mild intermittent asthma with (acute) exacerbation: Secondary | ICD-10-CM

## 2015-02-21 DIAGNOSIS — J302 Other seasonal allergic rhinitis: Secondary | ICD-10-CM | POA: Diagnosis not present

## 2015-02-21 MED ORDER — FEXOFENADINE HCL 180 MG PO TABS: 180.0000 mg | ORAL_TABLET | Freq: Every day | ORAL | Status: AC

## 2015-02-21 MED ORDER — ALBUTEROL SULFATE HFA 108 (90 BASE) MCG/ACT IN AERS: 2.0000 | INHALATION_SPRAY | RESPIRATORY_TRACT | Status: DC | PRN

## 2015-02-21 MED ORDER — PREDNISONE 10 MG (48) PO TBPK: ORAL_TABLET | ORAL | Status: DC

## 2015-02-21 MED ORDER — TRIAMCINOLONE ACETONIDE 55 MCG/ACT NA AERO: 2.0000 | INHALATION_SPRAY | Freq: Every day | NASAL | Status: DC

## 2015-02-21 MED ORDER — ALBUTEROL SULFATE (2.5 MG/3ML) 0.083% IN NEBU
2.5000 mg | INHALATION_SOLUTION | Freq: Once | RESPIRATORY_TRACT | Status: AC
  Administered 2015-02-21: 2.5 mg via RESPIRATORY_TRACT

## 2015-02-21 MED ORDER — ALBUTEROL SULFATE (2.5 MG/3ML) 0.083% IN NEBU
5.0000 mg | INHALATION_SOLUTION | Freq: Once | RESPIRATORY_TRACT | Status: AC
  Administered 2015-02-21: 5 mg via RESPIRATORY_TRACT

## 2015-02-21 MED ORDER — IPRATROPIUM BROMIDE 0.02 % IN SOLN
0.5000 mg | Freq: Once | RESPIRATORY_TRACT | Status: AC
  Administered 2015-02-21: 0.5 mg via RESPIRATORY_TRACT

## 2015-02-21 NOTE — Progress Notes (Signed)
[facility] [address], [place] [id number] [phone]  Date:  02/21/2015   Name:  Evan Johnson   DOB:  1981/02/09   MRN:  [id number]  PCP:  No PCP Per Patient    Chief Complaint: Shortness of Breath   History of Present Illness:  Evan Johnson is a 34 y.o. very pleasant male patient who presents with the following:  Patient has wheezing and shortness of breath with exertion over past two weeks Worse recently Has watery nasal drainage and congestion No fever or chills Non smoker No nausea or vomiting No history of asthma or inhaler use No cough No improvement with over the counter medications or other home remedies.  Denies other complaint or health concern today.   There are no active problems to display for this patient.   History reviewed. No pertinent past medical history.  History reviewed. No pertinent past surgical history.  History  Substance Use Topics  . Smoking status: Never Smoker   . Smokeless tobacco: Not on file  . Alcohol Use: Yes    History reviewed. No pertinent family history.  No Known Allergies  Medication list has been reviewed and updated.  Current Outpatient Prescriptions on File Prior to Visit  Medication Sig Dispense Refill  . ibuprofen (ADVIL,MOTRIN) 200 MG tablet Take 200 mg by mouth every 6 (six) hours as needed.     No current facility-administered medications on file prior to visit.    Review of Systems:  As per HPI, otherwise negative.    Physical Examination: Filed Vitals:   02/21/15 1849  BP: 124/86  Pulse: 63  Temp: 98.5 F (36.9 C)  Resp: 16   Filed Vitals:   02/21/15 1849  Height: 5\' 7"  (1.702 m)  Weight: 172 lb (78.019 kg)   Body mass index is 26.93 kg/(m^2). Ideal Body Weight: Weight in (lb) to have BMI = 25: 159.3  GEN: WDWN, NAD, Non-toxic, A & O x 3 HEENT: Atraumatic, Normocephalic. Neck supple. No masses, No LAD. Ears and Nose: No external deformity. CV: RRR, No  M/G/R. No JVD. No thrill. No extra heart sounds. PULM: CTA B, diffuse wheezes with poor air movement, no crackles, rhonchi. No retractions. No resp. distress. No accessory muscle use.  Speaks in full sentences ABD: S, NT, ND, +BS. No rebound. No HSM. EXTR: No c/c/e NEURO Normal gait.  PSYCH: Normally interactive. Conversant. Not depressed or anxious appearing.  Calm demeanor.    Assessment and Plan: Asthma Seasonal allergic rhinitis  Signed,  Phillips OdorJeffery Trilby Way, MD   Marked improvement with neb #2

## 2015-02-21 NOTE — Patient Instructions (Signed)
Metered Dose Inhaler (No Spacer Used)  Inhaled medicines are the basis of treatment for asthma and other breathing problems. Inhaled medicine can only be effective if used properly. Good technique assures that the medicine reaches the lungs.  Metered dose inhalers (MDIs) are used to deliver a variety of inhaled medicines. These include quick relief or rescue medicines (such as bronchodilators) and controller medicines (such as corticosteroids). The medicine is delivered by pushing down on a metal canister to release a set amount of spray.  If you are using different kinds of inhalers, use your quick relief medicine to open the airways 10-15 minutes before using a steroid, if instructed to do so by your health care provider. If you are unsure which inhalers to use and the order of using them, ask your health care provider, nurse, or respiratory therapist.  HOW TO USE THE INHALER  1. Remove the cap from the inhaler.  2. If you are using the inhaler for the first time, you will need to prime it. Shake the inhaler for 5 seconds and release four puffs into the air, away from your face. Ask your health care provider or pharmacist if you have questions about priming your inhaler.  3. Shake the inhaler for 5 seconds before each breath in (inhalation).  4. Position the inhaler so that the top of the canister faces up.  5. Put your index finger on the top of the medicine canister. Your thumb supports the bottom of the inhaler.  6. Open your mouth.  7. Either place the inhaler between your teeth and place your lips tightly around the mouthpiece, or hold the inhaler 1-2 inches away from your open mouth. If you are unsure of which technique to use, ask your health care provider.  8. Breathe out (exhale) normally and as completely as possible.  9. Press the canister down with the index finger to release the medicine.  10. At the same time as the canister is pressed, inhale deeply and slowly until your lungs are completely filled.  This should take 4-6 seconds. Keep your tongue down.  11. Hold the medicine in your lungs for 5-10 seconds (10 seconds is best). This helps the medicine get into the small airways of your lungs.  12. Breathe out slowly, through pursed lips. Whistling is an example of pursed lips.  13. Wait at least 1 minute between puffs. Continue with the above steps until you have taken the number of puffs your health care provider has ordered. Do not use the inhaler more than your health care provider directs you to.  14. Replace the cap on the inhaler.  15. Follow the directions from your health care provider or the inhaler insert for cleaning the inhaler.  If you are using a steroid inhaler, after your last puff, rinse your mouth with water, gargle, and spit out the water. Do not swallow the water.  AVOID:  · Inhaling before or after starting the spray of medicine. It takes practice to coordinate your breathing with triggering the spray.  · Inhaling through the nose (rather than the mouth) when triggering the spray.  HOW TO DETERMINE IF YOUR INHALER IS FULL OR NEARLY EMPTY  You cannot know when an inhaler is empty by shaking it. Some inhalers are now being made with dose counters. Ask your health care provider for a prescription that has a dose counter if you feel you need that extra help. If your inhaler does not have a counter, ask your health care   provider to help you determine the date you need to refill your inhaler. Write the refill date on a calendar or your inhaler canister. Refill your inhaler 7-10 days before it runs out. Be sure to keep an adequate supply of medicine. This includes making sure it has not expired, and making sure you have a spare inhaler.  SEEK MEDICAL CARE IF:  · Symptoms are only partially relieved with your inhaler.  · You are having trouble using your inhaler.  · You experience an increase in phlegm.  SEEK IMMEDIATE MEDICAL CARE IF:  · You feel little or no relief with your inhalers. You are still  wheezing and feeling shortness of breath, tightness in your chest, or both.  · You have dizziness, headaches, or a fast heart rate.  · You have chills, fever, or night sweats.  · There is a noticeable increase in phlegm production, or there is blood in the phlegm.  MAKE SURE YOU:  · Understand these instructions.  · Will watch your condition.  · Will get help right away if you are not doing well or get worse.  Document Released: 08/16/2007 Document Revised: 03/05/2014 Document Reviewed: 04/06/2013  [employer]® Patient Information ©2015 [employer]. This information is not intended to replace advice given to you by your health care provider. Make sure you discuss any questions you have with your health care provider.

## 2016-08-29 IMAGING — CR DG SINUSES 1-2V
1 series · 1 of 1 positions shown · non-contrast
Comparison: None.

CLINICAL DATA: Dental abscess

EXAM:
PARANASAL SINUSES - 1-2 VIEW

[waters]
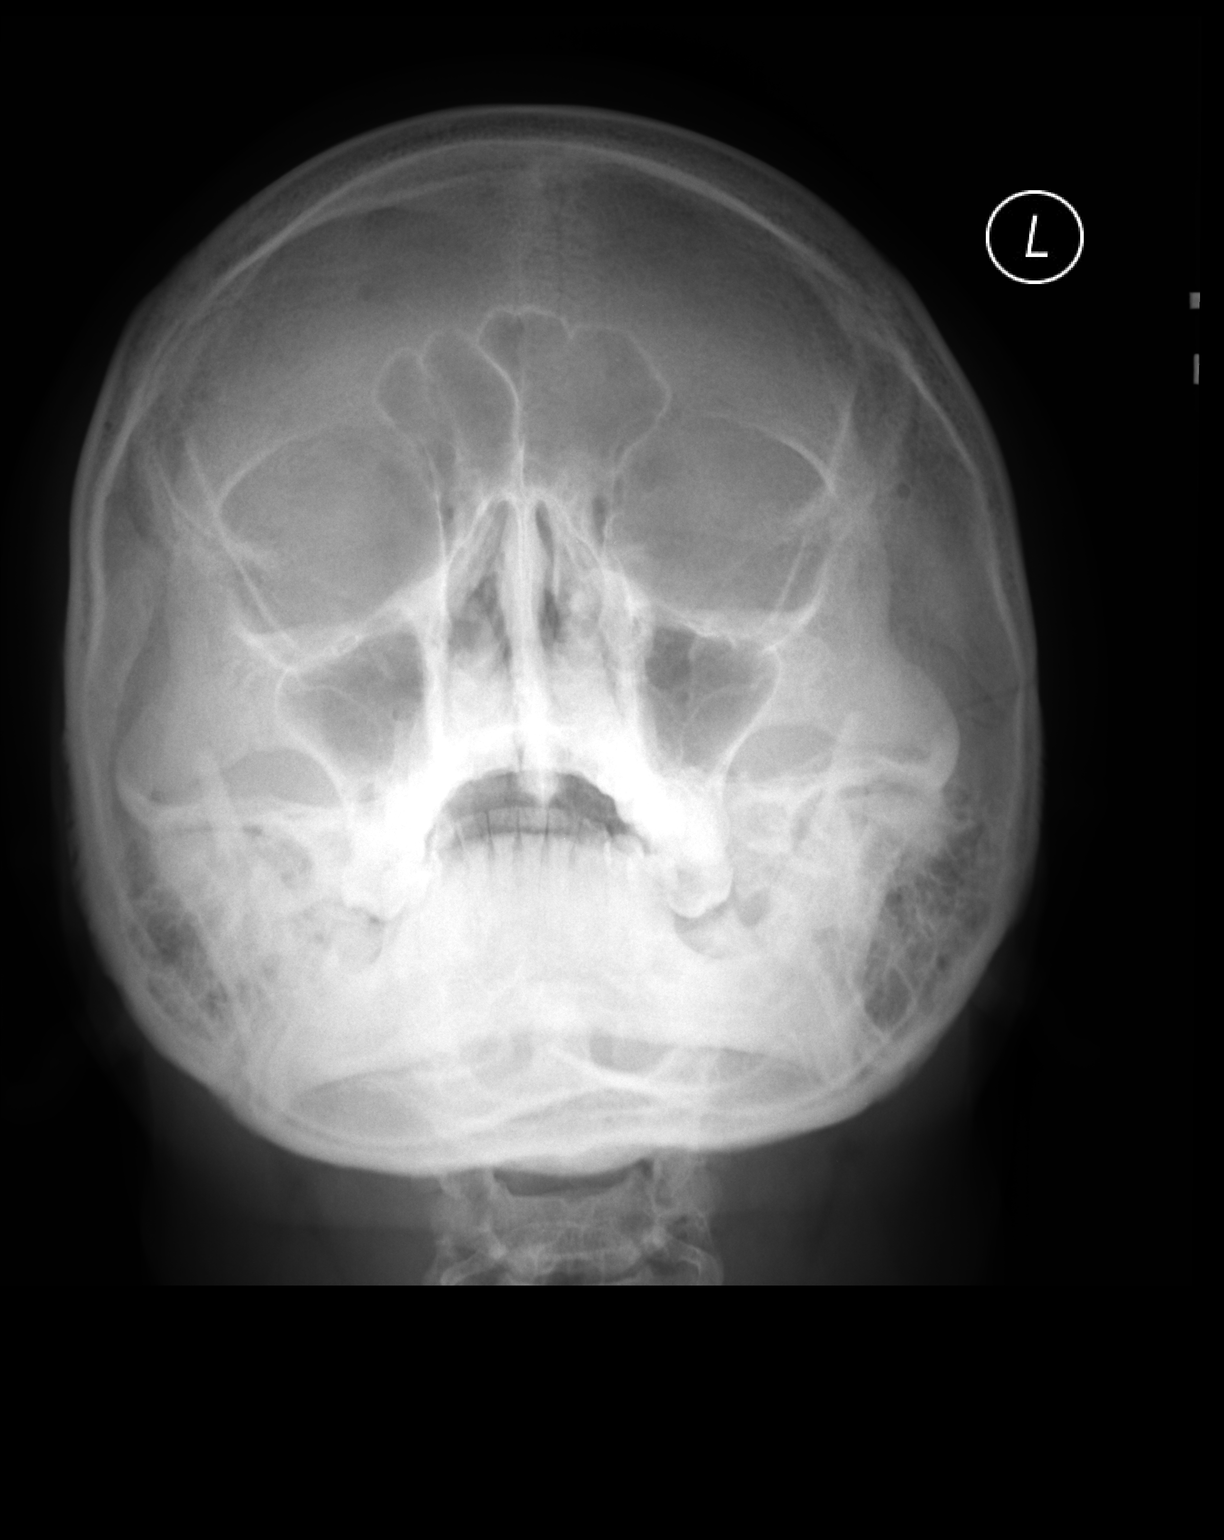

[1 of 1 positions shown; findings below may reference images not displayed]

FINDINGS: The paranasal sinus are aerated. There is no evidence of sinus
opacification air-fluid levels or mucosal thickening. No significant
bone abnormalities are seen.
IMPRESSION: Negative.

## 2017-02-22 ENCOUNTER — Ambulatory Visit (INDEPENDENT_AMBULATORY_CARE_PROVIDER_SITE_OTHER): Payer: 59 | Admitting: Physician Assistant

## 2017-02-22 VITALS — BP 114/75 | HR 64 | Temp 98.8°F | Resp 16 | Ht 66.0 in | Wt 164.0 lb

## 2017-02-22 DIAGNOSIS — J301 Allergic rhinitis due to pollen: Secondary | ICD-10-CM

## 2017-02-22 DIAGNOSIS — R062 Wheezing: Secondary | ICD-10-CM | POA: Diagnosis not present

## 2017-02-22 MED ORDER — IPRATROPIUM BROMIDE 0.02 % IN SOLN
0.5000 mg | Freq: Once | RESPIRATORY_TRACT | Status: AC
  Administered 2017-02-22: 0.5 mg via RESPIRATORY_TRACT

## 2017-02-22 MED ORDER — PREDNISONE 20 MG PO TABS: ORAL_TABLET | ORAL | 0 refills | Status: AC

## 2017-02-22 MED ORDER — ALBUTEROL SULFATE (2.5 MG/3ML) 0.083% IN NEBU
2.5000 mg | INHALATION_SOLUTION | Freq: Once | RESPIRATORY_TRACT | Status: AC
  Administered 2017-02-22: 2.5 mg via RESPIRATORY_TRACT

## 2017-02-22 MED ORDER — ALBUTEROL SULFATE HFA 108 (90 BASE) MCG/ACT IN AERS: 2.0000 | INHALATION_SPRAY | Freq: Four times a day (QID) | RESPIRATORY_TRACT | 0 refills | Status: AC | PRN

## 2017-02-22 MED ORDER — TRIAMCINOLONE ACETONIDE 55 MCG/ACT NA AERO: 2.0000 | INHALATION_SPRAY | Freq: Every day | NASAL | 12 refills | Status: AC

## 2017-02-22 NOTE — Progress Notes (Signed)
02/22/2017 1:22 PM   DOB: [birth year] / MRN: [id number]  SUBJECTIVE:  Evan Johnson is a 36 y.o. male presenting for coughing and sneezing that started 3-4 days ago.  Associates SOB with the cough that is worse at night.  He feels okay during the day.  He has been taking dayquil, nyquil, and theraflu. He has a history of asthma. Denies history of diabetes.  Is very physically active.   He has No Known Allergies.   He  has no past medical history on file.    He  reports that he has never smoked. He has never used smokeless tobacco. He reports that he does not drink alcohol or use drugs. He  reports that he does not engage in sexual activity. The patient  has no past surgical history on file.  His family history is not on file.  Review of Systems  Constitutional: Negative for fever.  Respiratory: Positive for cough, shortness of breath and wheezing. Negative for hemoptysis and sputum production.   Cardiovascular: Negative for chest pain.  Gastrointestinal: Negative for nausea.  Skin: Negative for rash.  Neurological: Negative for dizziness.    The problem list and medications were reviewed and updated by myself where necessary and exist elsewhere in the encounter.   OBJECTIVE:  BP 114/75 (BP Location: Right Arm, Patient Position: Sitting, Cuff Size: Large)   Pulse 64   Temp 98.8 F (37.1 C) (Oral)   Resp 16   Ht  (1.676 m)   Wt 164 lb (74.4 kg)   SpO2 99%   PF 225 L/min Comment: after treatment  BMI 26.47 kg/m   Physical Exam  Constitutional: He appears well-developed. He is active and cooperative.  Non-toxic appearance.  Cardiovascular: Normal rate, regular rhythm, S1 normal, S2 normal, normal heart sounds, intact distal pulses and normal pulses.  Exam reveals no gallop and no friction rub.   No murmur heard. Pulmonary/Chest: Effort normal. No tachypnea. He has wheezes (diffuse and throughout respiration). He has no rales.  Abdominal: He exhibits no distension.   Musculoskeletal: He exhibits no edema.  Neurological: He is alert.  Skin: Skin is warm and dry. He is not diaphoretic. No pallor.  Vitals reviewed.    No results found for this or any previous visit (from the past 72 hour(s)).  No results found.  ASSESSMENT AND PLAN:  Doc was seen today for shortness of breath and medication refill.  Diagnoses and all orders for this visit:  Wheezing: History of asthma.  Improved with nebs here but did not resolve.  Starting pred.  He has had this before and did well.  He will come back tomorrow or the next day if not improving.   -     albuterol (PROVENTIL) (2.5 MG/3ML) 0.083% nebulizer solution 2.5 mg; Take 3 mLs (2.5 mg total) by nebulization once. -     ipratropium (ATROVENT) nebulizer solution 0.5 mg; Take 2.5 mLs (0.5 mg total) by nebulization once. -     albuterol (PROVENTIL) (2.5 MG/3ML) 0.083% nebulizer solution 2.5 mg; Take 3 mLs (2.5 mg total) by nebulization once. -     ipratropium (ATROVENT) nebulizer solution 0.5 mg; Take 2.5 mLs (0.5 mg total) by nebulization once. -     predniSONE (DELTASONE) 20 MG tablet; Take 3 in the morning for 3 days, then 2 in the morning for 3 days, and then 1 in the morning for 3 days. -     albuterol (PROVENTIL HFA;VENTOLIN HFA) 108 (90 Base) MCG/ACT inhaler;  Inhale 2 puffs into the lungs every 6 (six) hours as needed for wheezing or shortness of breath. -     Care order/instruction:  Seasonal allergic rhinitis due to pollen -     triamcinolone (NASACORT AQ) 55 MCG/ACT AERO nasal inhaler; Place 2 sprays into the nose daily.    The patient is advised to call or return to clinic if he does not see an improvement in symptoms, or to seek the care of the closest emergency department if he worsens with the above plan.   Deliah Boston, MHS, PA-C [facility] 02/22/2017 1:22 PM

## 2017-02-22 NOTE — Patient Instructions (Addendum)
Come back in 48 hours if you are not feeling better.     IF you received an x-ray today, you will receive an invoice from [facility]. Please contact [facility] at [phone] with questions or concerns regarding your invoice.   IF you received labwork today, you will receive an invoice from [place]. Please contact [facility] at [phone] with questions or concerns regarding your invoice.   Our billing staff will not be able to assist you with questions regarding bills from these companies.  You will be contacted with the lab results as soon as they are available. The fastest way to get your results is to activate your My Chart account. Instructions are located on the last page of this paperwork. If you have not heard from [place] regarding the results in 2 weeks, please contact this office.
